# Patient Record
Sex: Female | Born: 1975 | Race: Black or African American | Hispanic: No | Marital: Single | Smoking: Never smoker
Health system: Southern US, Community
[De-identification: ages and names within clinical notes are randomized; demographics above are authoritative.]

## PROBLEM LIST (undated history)

## (undated) DIAGNOSIS — J302 Other seasonal allergic rhinitis: Secondary | ICD-10-CM

## (undated) HISTORY — DX: Other seasonal allergic rhinitis: J30.2

---

## 2006-11-08 ENCOUNTER — Emergency Department (HOSPITAL_COMMUNITY): Admission: EM | Admit: 2006-11-08 | Discharge: 2006-11-08 | Payer: Self-pay | Admitting: Emergency Medicine

## 2006-11-21 ENCOUNTER — Encounter (HOSPITAL_COMMUNITY): Admission: RE | Admit: 2006-11-21 | Discharge: 2006-12-10 | Payer: Self-pay | Admitting: Family Medicine

## 2006-12-12 ENCOUNTER — Encounter (HOSPITAL_COMMUNITY): Admission: RE | Admit: 2006-12-12 | Discharge: 2007-01-11 | Payer: Self-pay | Admitting: Family Medicine

## 2007-01-14 ENCOUNTER — Encounter (HOSPITAL_COMMUNITY): Admission: RE | Admit: 2007-01-14 | Discharge: 2007-02-13 | Payer: Self-pay | Admitting: Family Medicine

## 2007-01-31 ENCOUNTER — Emergency Department (HOSPITAL_COMMUNITY): Admission: EM | Admit: 2007-01-31 | Discharge: 2007-01-31 | Payer: Self-pay | Admitting: Family Medicine

## 2007-04-04 ENCOUNTER — Ambulatory Visit (HOSPITAL_COMMUNITY): Admission: RE | Admit: 2007-04-04 | Discharge: 2007-04-04 | Payer: Self-pay | Admitting: Unknown Physician Specialty

## 2007-05-27 ENCOUNTER — Encounter: Payer: Self-pay | Admitting: Orthopedic Surgery

## 2007-05-29 ENCOUNTER — Ambulatory Visit: Payer: Self-pay | Admitting: Orthopedic Surgery

## 2007-06-07 ENCOUNTER — Encounter (HOSPITAL_COMMUNITY): Admission: RE | Admit: 2007-06-07 | Discharge: 2007-07-07 | Payer: Self-pay | Admitting: Orthopedic Surgery

## 2007-07-08 ENCOUNTER — Encounter (HOSPITAL_COMMUNITY): Admission: RE | Admit: 2007-07-08 | Discharge: 2007-08-07 | Payer: Self-pay | Admitting: Orthopedic Surgery

## 2007-07-11 ENCOUNTER — Ambulatory Visit: Payer: Self-pay | Admitting: Orthopedic Surgery

## 2007-08-26 ENCOUNTER — Ambulatory Visit: Payer: Self-pay | Admitting: Orthopedic Surgery

## 2007-10-24 ENCOUNTER — Ambulatory Visit (HOSPITAL_COMMUNITY): Admission: RE | Admit: 2007-10-24 | Discharge: 2007-10-24 | Payer: Self-pay | Admitting: Otolaryngology

## 2007-10-28 ENCOUNTER — Ambulatory Visit: Payer: Self-pay | Admitting: Orthopedic Surgery

## 2007-10-28 DIAGNOSIS — S93409A Sprain of unspecified ligament of unspecified ankle, initial encounter: Secondary | ICD-10-CM | POA: Insufficient documentation

## 2007-11-18 ENCOUNTER — Ambulatory Visit: Payer: Self-pay | Admitting: Orthopedic Surgery

## 2007-12-16 ENCOUNTER — Ambulatory Visit: Payer: Self-pay | Admitting: Orthopedic Surgery

## 2007-12-26 ENCOUNTER — Telehealth: Payer: Self-pay | Admitting: Orthopedic Surgery

## 2007-12-31 ENCOUNTER — Ambulatory Visit: Payer: Self-pay | Admitting: Orthopedic Surgery

## 2007-12-31 ENCOUNTER — Ambulatory Visit (HOSPITAL_COMMUNITY): Admission: RE | Admit: 2007-12-31 | Discharge: 2007-12-31 | Payer: Self-pay | Admitting: Orthopedic Surgery

## 2008-01-02 ENCOUNTER — Ambulatory Visit: Payer: Self-pay | Admitting: Orthopedic Surgery

## 2008-01-06 ENCOUNTER — Encounter (HOSPITAL_COMMUNITY): Admission: RE | Admit: 2008-01-06 | Discharge: 2008-02-05 | Payer: Self-pay | Admitting: Orthopedic Surgery

## 2008-01-06 ENCOUNTER — Encounter: Payer: Self-pay | Admitting: Orthopedic Surgery

## 2008-01-08 ENCOUNTER — Encounter: Payer: Self-pay | Admitting: Orthopedic Surgery

## 2008-01-08 ENCOUNTER — Emergency Department (HOSPITAL_COMMUNITY): Admission: EM | Admit: 2008-01-08 | Discharge: 2008-01-08 | Payer: Self-pay | Admitting: Emergency Medicine

## 2008-01-09 ENCOUNTER — Ambulatory Visit: Payer: Self-pay | Admitting: Orthopedic Surgery

## 2008-01-16 ENCOUNTER — Ambulatory Visit: Payer: Self-pay | Admitting: Orthopedic Surgery

## 2008-01-29 ENCOUNTER — Encounter: Payer: Self-pay | Admitting: Orthopedic Surgery

## 2008-01-30 ENCOUNTER — Ambulatory Visit: Payer: Self-pay | Admitting: Orthopedic Surgery

## 2008-02-11 ENCOUNTER — Encounter (HOSPITAL_COMMUNITY): Admission: RE | Admit: 2008-02-11 | Discharge: 2008-03-12 | Payer: Self-pay | Admitting: Orthopedic Surgery

## 2008-02-13 ENCOUNTER — Ambulatory Visit: Payer: Self-pay | Admitting: Orthopedic Surgery

## 2008-02-26 ENCOUNTER — Ambulatory Visit (HOSPITAL_COMMUNITY): Payer: Self-pay | Admitting: Psychiatry

## 2008-03-06 ENCOUNTER — Ambulatory Visit (HOSPITAL_COMMUNITY): Payer: Self-pay | Admitting: Psychiatry

## 2008-03-10 ENCOUNTER — Encounter: Payer: Self-pay | Admitting: Orthopedic Surgery

## 2008-03-11 ENCOUNTER — Ambulatory Visit (HOSPITAL_COMMUNITY): Payer: Self-pay | Admitting: Psychiatry

## 2008-03-12 ENCOUNTER — Ambulatory Visit: Payer: Self-pay | Admitting: Orthopedic Surgery

## 2008-03-13 ENCOUNTER — Encounter (HOSPITAL_COMMUNITY): Admission: RE | Admit: 2008-03-13 | Discharge: 2008-04-12 | Payer: Self-pay | Admitting: Orthopedic Surgery

## 2008-03-19 ENCOUNTER — Ambulatory Visit (HOSPITAL_COMMUNITY): Payer: Self-pay | Admitting: Psychiatry

## 2008-03-19 ENCOUNTER — Encounter: Payer: Self-pay | Admitting: Orthopedic Surgery

## 2008-03-31 ENCOUNTER — Encounter: Payer: Self-pay | Admitting: Orthopedic Surgery

## 2008-04-01 ENCOUNTER — Ambulatory Visit (HOSPITAL_COMMUNITY): Payer: Self-pay | Admitting: Psychiatry

## 2008-04-21 ENCOUNTER — Ambulatory Visit: Payer: Self-pay | Admitting: Orthopedic Surgery

## 2008-04-28 ENCOUNTER — Ambulatory Visit (HOSPITAL_COMMUNITY): Payer: Self-pay | Admitting: Psychiatry

## 2008-04-30 ENCOUNTER — Encounter: Payer: Self-pay | Admitting: Orthopedic Surgery

## 2008-05-05 ENCOUNTER — Ambulatory Visit (HOSPITAL_COMMUNITY): Payer: Self-pay | Admitting: Psychiatry

## 2008-05-08 ENCOUNTER — Encounter (HOSPITAL_COMMUNITY): Admission: RE | Admit: 2008-05-08 | Discharge: 2008-06-07 | Payer: Self-pay | Admitting: Orthopedic Surgery

## 2008-05-14 ENCOUNTER — Ambulatory Visit (HOSPITAL_COMMUNITY): Payer: Self-pay | Admitting: Psychiatry

## 2008-05-26 ENCOUNTER — Telehealth: Payer: Self-pay | Admitting: Orthopedic Surgery

## 2008-05-27 ENCOUNTER — Ambulatory Visit (HOSPITAL_COMMUNITY): Payer: Self-pay | Admitting: Psychiatry

## 2008-06-10 ENCOUNTER — Ambulatory Visit: Payer: Self-pay | Admitting: Orthopedic Surgery

## 2008-06-16 ENCOUNTER — Ambulatory Visit (HOSPITAL_COMMUNITY): Payer: Self-pay | Admitting: Psychiatry

## 2008-07-01 ENCOUNTER — Ambulatory Visit (HOSPITAL_COMMUNITY): Payer: Self-pay | Admitting: Psychiatry

## 2008-07-03 ENCOUNTER — Encounter: Payer: Self-pay | Admitting: Orthopedic Surgery

## 2008-07-09 ENCOUNTER — Encounter: Payer: Self-pay | Admitting: Orthopedic Surgery

## 2008-07-10 ENCOUNTER — Telehealth: Payer: Self-pay | Admitting: Orthopedic Surgery

## 2008-07-15 ENCOUNTER — Ambulatory Visit (HOSPITAL_COMMUNITY): Payer: Self-pay | Admitting: Psychiatry

## 2008-08-24 ENCOUNTER — Ambulatory Visit (HOSPITAL_COMMUNITY): Payer: Self-pay | Admitting: Psychiatry

## 2008-09-07 ENCOUNTER — Ambulatory Visit (HOSPITAL_COMMUNITY): Payer: Self-pay | Admitting: Psychiatry

## 2008-12-28 ENCOUNTER — Ambulatory Visit: Payer: Self-pay | Admitting: Orthopedic Surgery

## 2008-12-28 DIAGNOSIS — M25579 Pain in unspecified ankle and joints of unspecified foot: Secondary | ICD-10-CM | POA: Insufficient documentation

## 2009-01-04 ENCOUNTER — Ambulatory Visit (HOSPITAL_COMMUNITY): Payer: Self-pay | Admitting: Psychiatry

## 2009-04-23 ENCOUNTER — Ambulatory Visit (HOSPITAL_COMMUNITY): Admission: RE | Admit: 2009-04-23 | Discharge: 2009-04-23 | Payer: Self-pay | Admitting: Gastroenterology

## 2010-06-21 ENCOUNTER — Emergency Department (HOSPITAL_COMMUNITY): Admission: EM | Admit: 2010-06-21 | Discharge: 2010-06-21 | Payer: Self-pay | Admitting: Emergency Medicine

## 2011-01-01 ENCOUNTER — Encounter: Payer: Self-pay | Admitting: Family Medicine
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
## 2011-08-31 LAB — HCG, QUANTITATIVE, PREGNANCY: hCG, Beta Chain, Quant, S: <2

## 2011-08-31 LAB — CBC
MCHC: 33.3
MCV: 91.3
Platelets: 157
WBC: 8

## 2011-08-31 LAB — DIFFERENTIAL
Basophils Absolute: 0
Basophils Relative: 0
Eosinophils Absolute: 0.2
Neutrophils Relative %: 67

## 2011-08-31 LAB — BASIC METABOLIC PANEL
BUN: 14
CO2: 28
Chloride: 104
Creatinine, Ser: 0.64

## 2014-09-28 ENCOUNTER — Ambulatory Visit: Payer: Self-pay | Admitting: Podiatry

## 2015-06-01 ENCOUNTER — Other Ambulatory Visit (INDEPENDENT_AMBULATORY_CARE_PROVIDER_SITE_OTHER): Payer: BC Managed Care – PPO

## 2015-06-01 ENCOUNTER — Ambulatory Visit (INDEPENDENT_AMBULATORY_CARE_PROVIDER_SITE_OTHER): Payer: BC Managed Care – PPO | Admitting: Family Medicine

## 2015-06-01 ENCOUNTER — Encounter: Payer: Self-pay | Admitting: Family Medicine

## 2015-06-01 VITALS — BP 122/74 | HR 71 | Ht 64.0 in | Wt 139.0 lb

## 2015-06-01 DIAGNOSIS — M25569 Pain in unspecified knee: Secondary | ICD-10-CM

## 2015-06-01 DIAGNOSIS — M899 Disorder of bone, unspecified: Secondary | ICD-10-CM

## 2015-06-01 DIAGNOSIS — M542 Cervicalgia: Secondary | ICD-10-CM | POA: Diagnosis not present

## 2015-06-01 NOTE — Progress Notes (Signed)
Pre visit review using our clinic review tool, if applicable. No additional management support is needed unless otherwise documented below in the visit note. 
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
## 2015-06-01 NOTE — Patient Instructions (Addendum)
Good to see you.  Ice 20 minutes 2 times daily. Usually after activity and before bed. Exercises 3 times a week.  pennsaid pinkie amount topically 2 times daily as needed. Can try on knee as well Vitamin D 2000 IU daily Turmeric 500mg  twice daily Monitor at eye level with sitting Tennisball between shoulder blades with sitting OK to take tennisball and rub area when needed, especially when sitting.  See me again in 4 weeks.

## 2015-06-01 NOTE — Assessment & Plan Note (Signed)
Neck pain is multifactorial. Patient does have full range of motion and negative Spurling's. I do not think that this is likely the problem. I do think the patient does have some scapular dyskinesia but more likely to cause more of the discomfort. We discussed muscle conditioning, postural changes that were, topical anti-inflammatory's and over-the-counter natural supplements. Patient will try to make these different changes and come back and see me again in 4 weeks for further evaluation and treatment.

## 2015-06-11 ENCOUNTER — Telehealth: Payer: Self-pay | Admitting: Family Medicine

## 2015-06-11 NOTE — Telephone Encounter (Signed)
Patient need a refill of the samples of Anti inflammatory cream you gave her , she do not know what the name of it is, please advise

## 2015-06-15 MED ORDER — DICLOFENAC SODIUM 2 % TD SOLN: TRANSDERMAL | Status: DC

## 2015-06-15 NOTE — Telephone Encounter (Signed)
rx sent into pharmacy, pt made aware.  

## 2015-06-29 ENCOUNTER — Ambulatory Visit (INDEPENDENT_AMBULATORY_CARE_PROVIDER_SITE_OTHER): Payer: BC Managed Care – PPO | Admitting: Family Medicine

## 2015-06-29 ENCOUNTER — Encounter: Payer: Self-pay | Admitting: Family Medicine

## 2015-06-29 VITALS — BP 118/78 | HR 73 | Wt 141.0 lb

## 2015-06-29 DIAGNOSIS — S83206A Unspecified tear of unspecified meniscus, current injury, right knee, initial encounter: Secondary | ICD-10-CM | POA: Diagnosis not present

## 2015-06-29 DIAGNOSIS — M899 Disorder of bone, unspecified: Secondary | ICD-10-CM

## 2015-06-29 NOTE — Progress Notes (Signed)
Tawana ScaleZach Smith D.O. [facility] [address] [place], [place] [address] Phone: [phone] Subjective:     CC: Neck pain follow up  BJY:NWGNFAOZHYHPI:Subjective Audrey Bradford is a 39 y.o. female coming in with complaint of neck pain. Patient was seen previously and had more of muscle imbalance. Patient was given postural changes, ergonomic changes to at work, home exercises, icing protocol and discussed over-the-counter natural supplementations. Patient states the neck pain is improving. Patient states as long as she does he exercises she has noticed some improvement. States that it is no longer affecting her work. Patient states that she is also not noticing any of the severe discomfort that was stopped him from activities.  Patient is coming in with new problem. States that she is having more of a right knee pain. Rates the severity of pain is 8 out of 10. Has seen another provider for this before and did have x-rays that she states were unremarkable. Patient did do some physical therapy with no benefit. Patient was sent to rheumatology with workup that was negative. Patient has not had any advanced imaging. Still hurts her when she goes up and down stairs. States that it's more on the medial aspect of the knee. Denies any radiation of pain. States that sometimes he can feel a little unstable. Denies any nighttime awakening.  History reviewed. No pertinent past medical history. History reviewed. No pertinent past surgical history. History  Substance Use Topics  . Smoking status: Never Smoker   . Smokeless tobacco: Not on file  . Alcohol Use: Not on file   No Known Allergies History reviewed. No pertinent family history.     Past medical history, social, surgical and family history all reviewed in electronic medical record.   Review of Systems: No headache, visual changes, nausea, vomiting, diarrhea, constipation, dizziness, abdominal pain, skin rash, fevers, chills, night sweats,  weight loss, swollen lymph nodes, body aches, joint swelling, muscle aches, chest pain, shortness of breath, mood changes.   Objective Blood pressure 118/78, pulse 73, weight 141 lb (63.957 kg), SpO2 97 %.  General: No apparent distress alert and oriented x3 mood and affect normal, dressed appropriately.  HEENT: Pupils equal, extraocular movements intact  Respiratory: Patient's speak in full sentences and does not appear short of breath  Cardiovascular: No lower extremity edema, non tender, no erythema  Skin: Warm dry intact with no signs of infection or rash on extremities or on axial skeleton.  Abdomen: Soft nontender  Neuro: Cranial nerves II through XII are intact, neurovascularly intact in all extremities with 2+ DTRs and 2+ pulses.  Lymph: No lymphadenopathy of posterior or anterior cervical chain or axillae bilaterally.  Gait normal with good balance and coordination.  MSK:  Non tender with full range of motion and good stability and symmetric strength and tone of shoulders, elbows, wrist, hip, and ankles bilaterally.  Neck: Inspection unremarkable. No palpable stepoffs. Negative Spurling's maneuver. Full neck range of motion Grip strength and sensation normal in bilateral hands Strength good C4 to T1 distribution No sensory change to C4 to T1 Negative Hoffman sign bilaterally Reflexes normal Mild scapular dyskinesia  Knee: Right Normal to inspection with no erythema or effusion or obvious bony abnormalities. Palpation normal with no warmth, joint line tenderness, patellar tenderness, or condyle tenderness. ROM full in flexion and extension and lower leg rotation. Ligaments with solid consistent endpoints including ACL, PCL, LCL, MCL. Positive Mcmurray's, Apley's, and Thessalonian tests. Non painful patellar compression. Patellar glide without crepitus.  Patellar and quadriceps tendons unremarkable. Hamstring and quadriceps strength is normal.   Procedure note After  informed written and verbal consent, patient was seated on exam table. Right knee was prepped with alcohol swab and utilizing anterolateral approach, patient's right knee space was injected with 4:1  marcaine 0.5%: Kenalog /dL. Patient tolerated the procedure well without immediate complications.   Impression and Recommendations:     This case required medical decision making of moderate complexity.

## 2015-06-29 NOTE — Assessment & Plan Note (Signed)
Improvement with the postural control. We discussed icing regimen and home exercises. We discussed continuing the topical anti-rheumatoid. We discussed continuing the ergonomic changes that I think is helping patient.

## 2015-06-29 NOTE — Patient Instructions (Addendum)
Good to see you Ice can and always will help with any inflammation Continue with  the tennis ball.  New exercises for the knee same about 3 times a week.  We tried injection in the knee and it should help See me again in 4-6 weeks.

## 2015-06-29 NOTE — Assessment & Plan Note (Signed)
Likely genitive in nature. We discussed icing regimen, home exercises, continuing the topical anti-inflammatory's. Patient was given an injection today and states that there was some benefit. Patient has not had an injection previously. We discussed range of motion exercises and patient was given a handout. Patient will try to make these changes and come back and see me again in 3-4 weeks for further evaluation and treatment.

## 2015-08-10 ENCOUNTER — Ambulatory Visit: Payer: BC Managed Care – PPO | Admitting: Family Medicine

## 2015-09-16 ENCOUNTER — Telehealth: Payer: Self-pay | Admitting: Family Medicine

## 2015-09-16 NOTE — Telephone Encounter (Signed)
Spoke to pt, she was requesting her BP at last OV for her insurance.

## 2015-09-16 NOTE — Telephone Encounter (Signed)
Pt request to speak to the assistant concern about last ov she had with Dr. Katrinka Blazing. Pt need some information from that last ov. Please call her back

## 2015-09-16 NOTE — Telephone Encounter (Signed)
lmovm for pt to return call.  

## 2018-04-30 ENCOUNTER — Ambulatory Visit (INDEPENDENT_AMBULATORY_CARE_PROVIDER_SITE_OTHER): Payer: BC Managed Care – PPO

## 2018-04-30 ENCOUNTER — Ambulatory Visit (HOSPITAL_COMMUNITY)
Admission: EM | Admit: 2018-04-30 | Discharge: 2018-04-30 | Disposition: A | Discharge status: Home / Self Care | Payer: BC Managed Care – PPO | Attending: Family Medicine | Admitting: Family Medicine

## 2018-04-30 ENCOUNTER — Encounter (HOSPITAL_COMMUNITY): Payer: Self-pay | Admitting: Family Medicine

## 2018-04-30 DIAGNOSIS — S93491A Sprain of other ligament of right ankle, initial encounter: Secondary | ICD-10-CM | POA: Diagnosis not present

## 2018-04-30 DIAGNOSIS — S60512A Abrasion of left hand, initial encounter: Secondary | ICD-10-CM | POA: Diagnosis not present

## 2018-04-30 MED ORDER — MUPIROCIN 2 % EX OINT: 1.0000 "application " | TOPICAL_OINTMENT | Freq: Three times a day (TID) | CUTANEOUS | 1 refills | Status: DC

## 2018-04-30 MED ORDER — DICLOFENAC SODIUM 75 MG PO TBEC: 75.0000 mg | DELAYED_RELEASE_TABLET | Freq: Two times a day (BID) | ORAL | 0 refills | Status: DC

## 2018-04-30 NOTE — ED Provider Notes (Signed)
[facility]   [id number] 04/30/18 Arrival Time: 7829   SUBJECTIVE:  Audrey Bradford is a 42 y.o. female who presents to the urgent care with complaint of left hand and right ankle pain after falling on concrete.  Right hypothenar area is "opened" with some possible dirt in the wound.  Good ROM.  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  History reviewed. No pertinent past medical history. History reviewed. No pertinent family history. Social History   Socioeconomic History  . Marital status: Single    Spouse name: Not on file  . Number of children: Not on file  . Years of education: Not on file  . Highest education level: Not on file  Occupational History  . Not on file  Social Needs  . Financial resource strain: Not on file  . Food insecurity:    Worry: Not on file    Inability: Not on file  . Transportation needs:    Medical: Not on file    Non-medical: Not on file  Tobacco Use  . Smoking status: Never Smoker  Substance and Sexual Activity  . Alcohol use: Not on file  . Drug use: Not on file  . Sexual activity: Not on file  Lifestyle  . Physical activity:    Days per week: Not on file    Minutes per session: Not on file  . Stress: Not on file  Relationships  . Social connections:    Talks on phone: Not on file    Gets together: Not on file    Attends religious service: Not on file    Active member of club or organization: Not on file    Attends meetings of clubs or organizations: Not on file    Relationship status: Not on file  . Intimate partner violence:    Fear of current or ex partner: Not on file    Emotionally abused: Not on file    Physically abused: Not on file    Forced sexual activity: Not on file  Other Topics Concern  . Not on file  Social History Narrative  . Not on file   No outpatient medications have been marked as taking for the 04/30/18 encounter [facility]  Encounter).   No Known Allergies    ROS: As per HPI, remainder of ROS negative.   OBJECTIVE:   Vitals:   04/30/18 1806  BP: 138/83  Pulse: 75  Resp: 18  SpO2: 100%     General appearance: alert; no distress Eyes: PERRL; EOMI; conjunctiva normal HENT: normocephalic; atraumatic;  oral mucosa normal Neck: supple Back: no CVA tenderness Extremities: no cyanosis or edema; symmetrical with no gross deformities;  Mild tenderness right lateral malleolus  The left hypothenar area has two abrasions taking up 1 cm in length and width each  Skin: warm and dry Neurologic: antalgic gait, otherwise grossly normal Psychological: alert and cooperative; normal mood and affect      Labs:  Results for orders placed or performed during the hospital encounter of 56/21/30  Basic metabolic panel  Result Value Ref Range   Sodium 139    Potassium 3.5    Chloride 104    CO2 28    Glucose, Bld 74    BUN 14    Creatinine, Ser 0.64    Calcium 9.4    GFR calc non Af Amer >60    GFR calc Af Amer      >  60        The eGFR has been calculated using the MDRD equation. This calculation has not been validated in all clinical  CBC  Result Value Ref Range   WBC 8.0    RBC 3.77 (L)    Hemoglobin 11.5 (L)    HCT 34.4 (L)    MCV 91.3    MCHC 33.3    RDW 12.7    Platelets 157   Differential  Result Value Ref Range   Neutrophils Relative % 67    Neutro Abs 5.3    Lymphocytes Relative 24    Lymphs Abs 1.9    Monocytes Relative 7    Monocytes Absolute 0.5    Eosinophils Relative 3    Eosinophils Absolute 0.2    Basophils Relative 0    Basophils Absolute 0.0   hCG, quantitative, pregnancy  Result Value Ref Range   hCG, Beta Chain, Quant, S      <2          GEST. AGE      CONC.  (mIU/mL)   <=1 WEEK        5 - 50     2 WEEKS       50 - 500     3 WEEKS       100 - 10,000    Labs Reviewed - No data to display  Dg Ankle Complete Right  Result Date: 04/30/2018 CLINICAL DATA:   42 year old female with a history ankle injury EXAM: RIGHT ANKLE - COMPLETE 3+ VIEW COMPARISON:  None. FINDINGS: No acute displaced fracture. Ankle mortise is congruent. No focal soft tissue swelling. No radiopaque foreign body. No evidence of joint effusion. IMPRESSION: Negative for acute bony abnormality Electronically Signed   By: Corrie Mckusick D.O.   On: 04/30/2018 18:39       ASSESSMENT & PLAN:  1. Sprain of anterior talofibular ligament of right ankle, initial encounter   2. Abrasion of palm of left hand, initial encounter   Wash the injury on the palm with soap and water at least three times a day and then apply Bactroban ointment and cover with bandage for the next three to five days.  Meds ordered this encounter  Medications  . mupirocin ointment (BACTROBAN) 2 %    Sig: Apply 1 application topically 3 (three) times daily.    Dispense:  22 g    Refill:  1  . diclofenac (VOLTAREN) 75 MG EC tablet    Sig: Take 1 tablet (75 mg total) by mouth 2 (two) times daily.    Dispense:  14 tablet    Refill:  0    Reviewed expectations re: course of current medical issues. Questions answered. Outlined signs and symptoms indicating need for more acute intervention. Patient verbalized understanding. After Visit Summary given.    Procedures:      Robyn Haber, MD 04/30/18 1859

## 2018-04-30 NOTE — ED Triage Notes (Signed)
Pt here for fall this afternoon. She fell on concrete gravel injuring left hand and right ankle.

## 2018-04-30 NOTE — Discharge Instructions (Addendum)
Wash the injury on the palm with soap and water at least three times a day and then apply Bactroban ointment and cover with bandage for the next three to five days.  Result Date: 04/30/2018 CLINICAL DATA:  42 year old female with a history ankle injury EXAM: RIGHT ANKLE - COMPLETE 3+ VIEW COMPARISON:  None. FINDINGS: No acute displaced fracture. Ankle mortise is congruent. No focal soft tissue swelling. No radiopaque foreign body. No evidence of joint effusion. IMPRESSION: Negative for acute bony abnormality Electronically Signed   By: Gilmer Mor D.O.   On: 04/30/2018 18:39

## 2018-08-25 IMAGING — DX DG ANKLE COMPLETE 3+V*R*
3 series · 3 of 3 positions shown · non-contrast
Comparison: None.

CLINICAL DATA: 41-year-old female with a history ankle injury

EXAM:
RIGHT ANKLE - COMPLETE 3+ VIEW

[ankle ap]
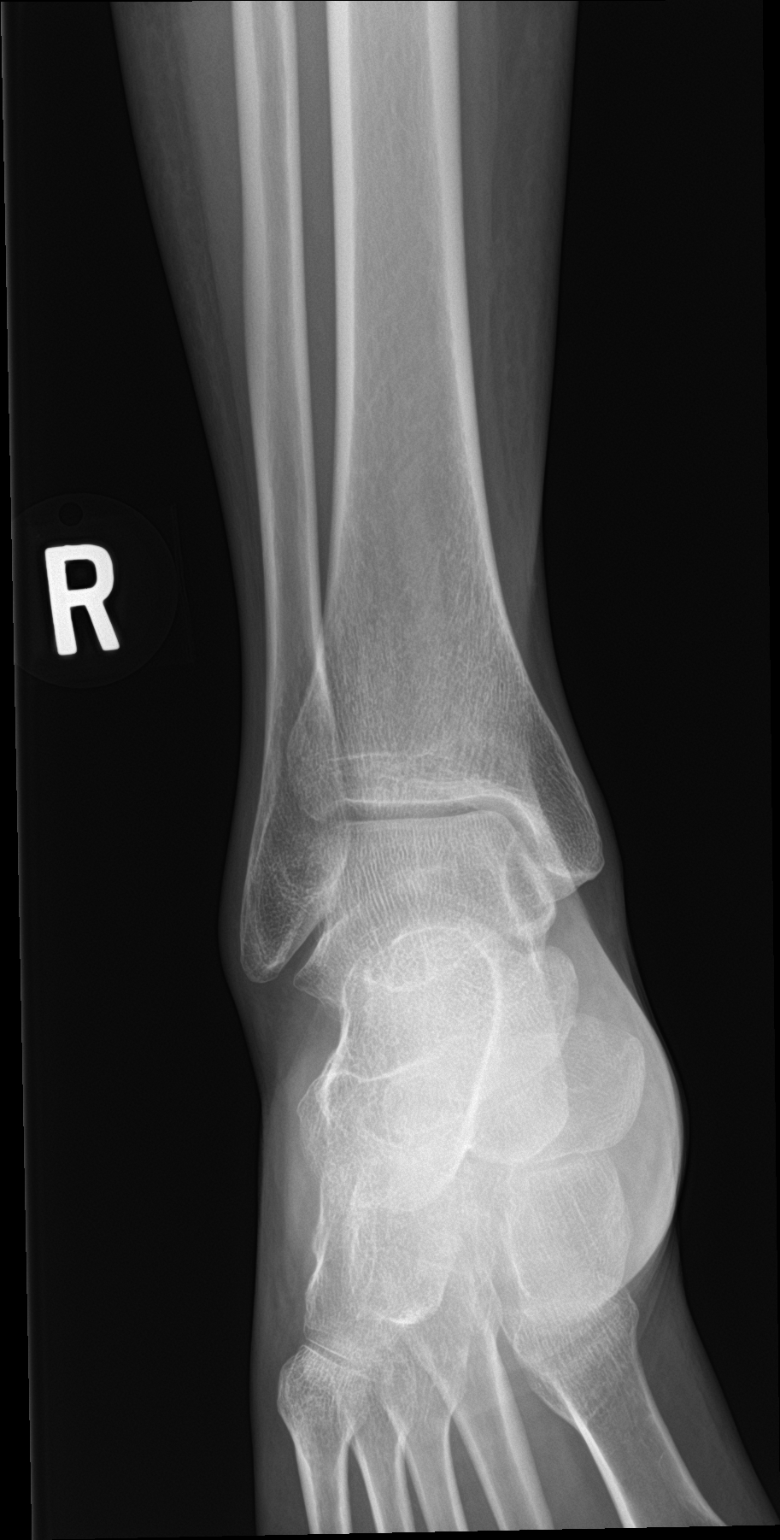

[ankle obl]
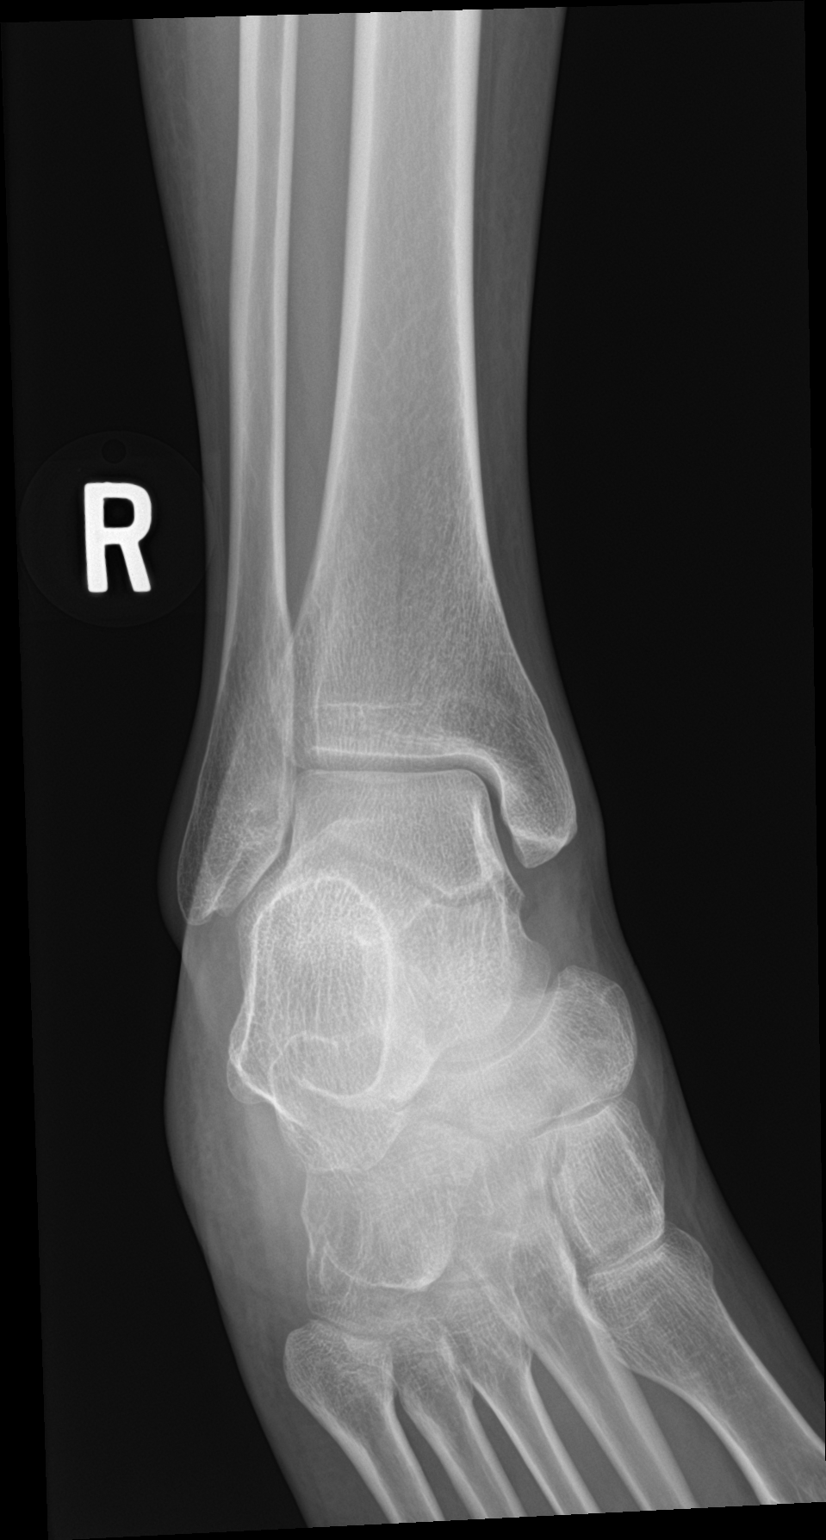

[ankle lat]
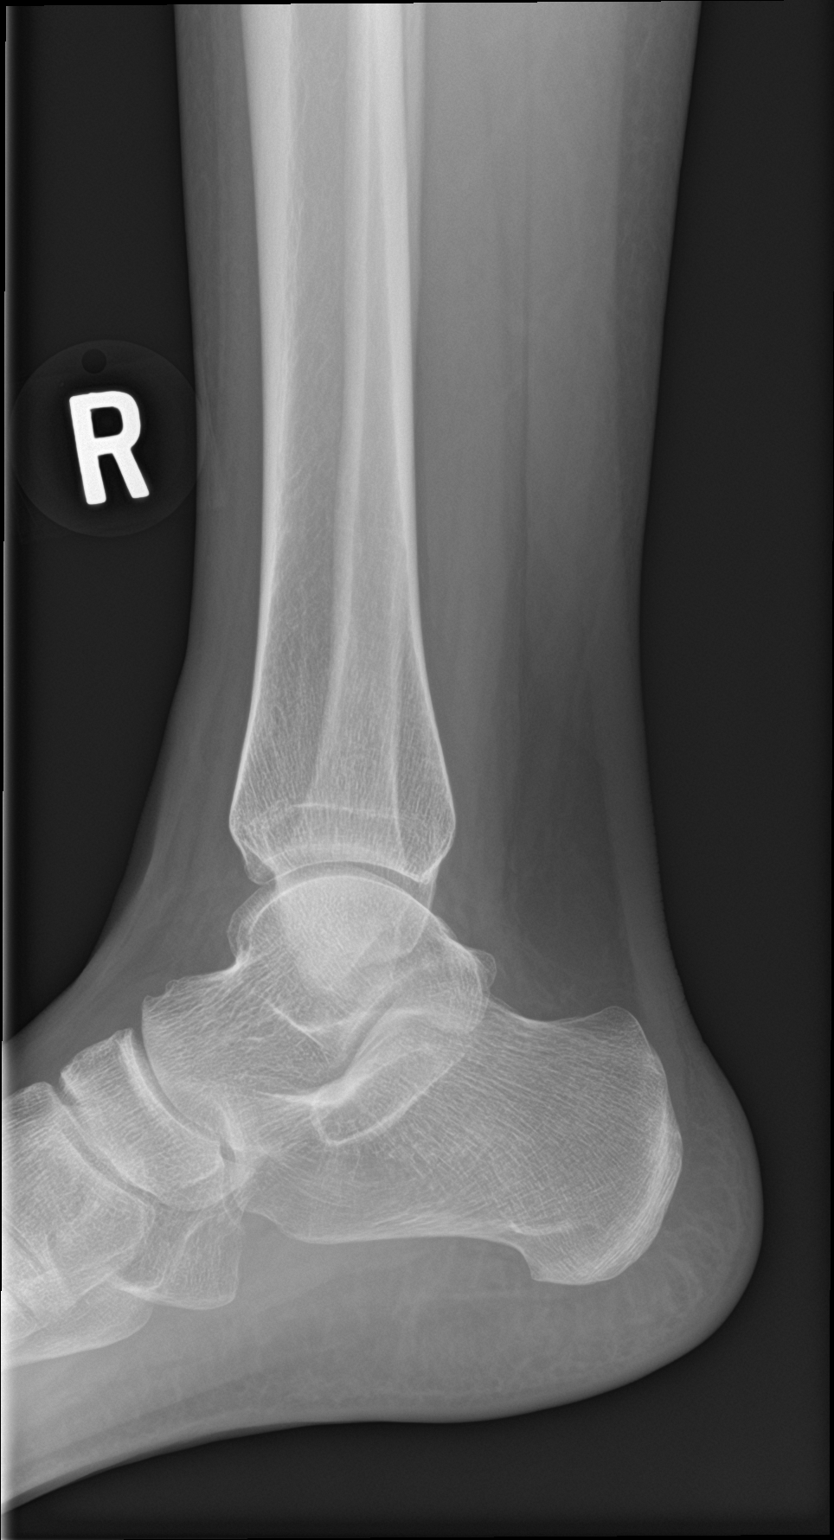

[3 of 3 positions shown; findings below may reference images not displayed]

FINDINGS: No acute displaced fracture. Ankle mortise is congruent. No focal
soft tissue swelling. No radiopaque foreign body. No evidence of
joint effusion.
IMPRESSION: Negative for acute bony abnormality

## 2018-10-02 ENCOUNTER — Ambulatory Visit: Payer: Self-pay | Admitting: Internal Medicine

## 2018-10-02 ENCOUNTER — Encounter: Payer: Self-pay | Admitting: Nurse Practitioner

## 2018-10-02 ENCOUNTER — Ambulatory Visit: Payer: BC Managed Care – PPO | Admitting: Internal Medicine

## 2018-10-02 ENCOUNTER — Encounter: Payer: Self-pay | Admitting: Internal Medicine

## 2018-10-02 VITALS — BP 162/94 | HR 76 | Temp 98.2°F | Ht 64.0 in | Wt 156.2 lb

## 2018-10-02 DIAGNOSIS — F4321 Adjustment disorder with depressed mood: Secondary | ICD-10-CM

## 2018-10-02 DIAGNOSIS — Z23 Encounter for immunization: Secondary | ICD-10-CM

## 2018-10-02 DIAGNOSIS — F5105 Insomnia due to other mental disorder: Secondary | ICD-10-CM | POA: Diagnosis not present

## 2018-10-02 DIAGNOSIS — M549 Dorsalgia, unspecified: Secondary | ICD-10-CM

## 2018-10-02 DIAGNOSIS — R030 Elevated blood-pressure reading, without diagnosis of hypertension: Secondary | ICD-10-CM

## 2018-10-02 DIAGNOSIS — F419 Anxiety disorder, unspecified: Secondary | ICD-10-CM

## 2018-10-02 DIAGNOSIS — F99 Mental disorder, not otherwise specified: Secondary | ICD-10-CM

## 2018-10-02 LAB — POCT URINALYSIS DIPSTICK
Bilirubin, UA: NEGATIVE
Glucose, UA: NEGATIVE
KETONES UA: NEGATIVE
Leukocytes, UA: NEGATIVE
NITRITE UA: NEGATIVE
PH UA: 6.5 (ref 5.0–8.0)
PROTEIN UA: NEGATIVE
RBC UA: NEGATIVE
Spec Grav, UA: 1.01 (ref 1.010–1.025)
Urobilinogen, UA: 0.2 E.U./dL

## 2018-10-03 LAB — TSH: TSH: 3.97 u[IU]/mL (ref 0.450–4.500)

## 2018-10-07 NOTE — Progress Notes (Signed)
Your thyroid function is the upper limit of normal. I will recheck this in six months or so.

## 2018-10-08 ENCOUNTER — Telehealth: Payer: Self-pay

## 2018-10-08 NOTE — Telephone Encounter (Signed)
Patient called stating she is supposed to be billed the fee to have forms filled out but has not received it yet and also she wanted to check the status on her paperwork and she stated she has additional forms that need to be filled out and she would like to email them to [place]. [phone]  Returned pt call and advised her the bill probably will take a couple days to get to her by mail. I also advised her forms take 7-10 business days to be filled out and if she has additional forms to be filled out she would have to drop them off to [place] or fax them over since we do not use email. YRL,RMA

## 2018-10-20 ENCOUNTER — Encounter: Payer: Self-pay | Admitting: Internal Medicine

## 2018-10-20 NOTE — Progress Notes (Signed)
Subjective:     Patient ID: Audrey Bradford , female    DOB: May 05, 1976 , 42 y.o.   MRN: [id number]   Chief Complaint  Patient presents with  . Anxiety  . kidney pain    HPI  She is here today for evaluation of anxiety. She initially was scheduled to see NP Audrey Bradford; however, she created quite a stir in the office and refused to talk to her about her issues. Therefore, I am now seeing the patient. She usually comes once yearly for physical exam. She moved to [place] after getting married, but commutes to [place] daily for work. She works for [employer].   Anxiety  Presents for initial visit. Onset was 1 to 6 months ago. The problem has been gradually worsening. Symptoms include depressed mood, insomnia, irritability and panic. Symptoms occur most days. The severity of symptoms is severe and interfering with daily activities. The symptoms are aggravated by family issues and work stress. The quality of sleep is poor. Nighttime awakenings: one to two.   Risk factors include marital problems.   She initially started to have issues several months ago when she and her husband started to have marital issues. She reports her sx have worsened since she was skipped over for a job position. She felt that management purposely made her think she would get the promotion, then it was given to someone else. Although she is disappointed, she has no suicidal ideations.   History reviewed. No pertinent past medical history.   Family History  Problem Relation Age of Onset  . Schizophrenia Mother   . Hypertension Mother   . Hypertension Father   . Diabetes Father   . Esophageal cancer Father   . Congestive Heart Failure Father      Current Outpatient Medications:  .  diclofenac (VOLTAREN) 75 MG EC tablet, Take 1 tablet (75 mg total) by mouth 2 (two) times daily., Disp: 14 tablet, Rfl: 0 .  Diclofenac Sodium 2 % SOLN, Apply 1 pump twice daily., Disp: 112 g, Rfl: 3 .   mupirocin ointment (BACTROBAN) 2 %, Apply 1 application topically 3 (three) times daily., Disp: 22 g, Rfl: 1   No Known Allergies   Review of Systems  Constitutional: Positive for irritability.  Respiratory: Negative.   Cardiovascular: Negative.   Gastrointestinal: Negative.   Neurological: Negative.   Psychiatric/Behavioral: Positive for agitation. The patient has insomnia.      Today's Vitals   10/02/18 1131  BP: (!) 162/94  Pulse: 76  Temp: 98.2 F (36.8 C)  TempSrc: Oral  Weight: 156 lb 3.2 oz (70.9 kg)  Height: 5\' 4"  (1.626 m)  PainSc: 8   PainLoc: Back   Body mass index is 26.81 kg/m.   Objective:  Physical Exam  Constitutional: She is oriented to person, place, and time. She appears well-developed and well-nourished.  Cardiovascular: Normal rate, regular rhythm and normal heart sounds.  Pulmonary/Chest: Effort normal and breath sounds normal.  Neurological: She is alert and oriented to person, place, and time.  Psychiatric: Her mood appears anxious.  She is also tearful.   Nursing note and vitals reviewed.       Assessment And Plan:     1. Anxiety  Unfortunately, she is experiencing both work and marital stressors. She is encouraged to start therapy. She is encouraged to first look into EAP program through her job. Pt advised other option is to seek counseling through a private provider. I suggest that she start rx  medication; however, she declines at this time. She is concerned about possible side effects. I will check thyroid function as well. She will be taken out of work for the next 3 weeks. She will rto in 3 weeks for re-evaluation. Pt advised she must seek counseling during this time. Greater than 50% of face to face time was spent in counseling and coordination of care. More than 40 minutes was spent with the patient.   - TSH  2. Adjustment disorder with depressed mood  Please see above. Again, she declines medication.   3. Back pain, unspecified  back location, unspecified back pain laterality, unspecified chronicity  Possibly due to musculoskeletal strain. U/a also performed to r/o urinary cause.   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 She will let me know if her sx persist. Good bedtime hygiene was also discussed with the patient. We will discuss further at her next visit.   5. Elevated blood pressure reading  I think this is related to her current distress.   She is encouraged to avoid adding salt to her foods. EKG performed, no acute changes noted. She will rto in six weeks for re-evaluation.   - EKG 12-Lead  6. Need for influenza vaccination  - Flu Vaccine QUAD 6+ mos PF IM (Fluarix Quad PF)        Gwynneth Aliment, MD

## 2018-10-21 ENCOUNTER — Encounter: Payer: Self-pay | Admitting: Internal Medicine

## 2018-10-21 ENCOUNTER — Telehealth: Payer: Self-pay

## 2018-10-21 NOTE — Telephone Encounter (Signed)
The pt called and left a message that she has seen Dr. Bevely Palmer at [facility] and her number is [phone].

## 2018-10-21 NOTE — Telephone Encounter (Signed)
Left the pt a message that Dr. Allyne Gee wants to know if the pt has sought canceling and if so Dr. Allyne Gee needs the name and number of the provider she has chosen.

## 2018-10-22 ENCOUNTER — Encounter: Payer: Self-pay | Admitting: Internal Medicine

## 2018-10-22 ENCOUNTER — Ambulatory Visit: Payer: BC Managed Care – PPO | Admitting: Internal Medicine

## 2018-10-22 VITALS — BP 114/80 | HR 71 | Temp 98.7°F | Ht 64.0 in | Wt 154.8 lb

## 2018-10-22 DIAGNOSIS — F419 Anxiety disorder, unspecified: Secondary | ICD-10-CM | POA: Diagnosis not present

## 2018-11-10 DIAGNOSIS — F419 Anxiety disorder, unspecified: Secondary | ICD-10-CM | POA: Insufficient documentation

## 2018-11-10 NOTE — Progress Notes (Signed)
  Subjective:     Patient ID: Audrey Bradford , female    DOB: Oct 21, 1976 , 42 y.o.   MRN: [id number]   Chief Complaint  Patient presents with  . Anxiety    HPI  Anxiety  Presents for follow-up visit. Symptoms include nervous/anxious behavior. Symptoms occur occasionally. The severity of symptoms is mild. The quality of sleep is fair. Nighttime awakenings: occasional.    SHE DID NOT START MEDS. HER SX HAVE IMPROVED WITH THERAPY. SHE DOES GET SLIGHTLY ANXIOUS WHEN THINKING ABOUT RETURNING TO WORK.    History reviewed. No pertinent past medical history.   Family History  Problem Relation Age of Onset  . Schizophrenia Mother   . Hypertension Mother   . Hypertension Father   . Diabetes Father   . Esophageal cancer Father   . Congestive Heart Failure Father      Current Outpatient Medications:  .  benzonatate (TESSALON) 200 MG capsule, , Disp: , Rfl: 0 .  ipratropium (ATROVENT) 0.03 % nasal spray, Place into the nose., Disp: , Rfl:    No Known Allergies   Review of Systems  Constitutional: Negative.   Respiratory: Negative.   Cardiovascular: Negative.   Gastrointestinal: Negative.   Neurological: Negative.   Psychiatric/Behavioral: The patient is nervous/anxious.      Today's Vitals   10/22/18 1519  BP: 114/80  Pulse: 71  Temp: 98.7 F (37.1 C)  TempSrc: Oral  Weight: 154 lb 12.8 oz (70.2 kg)  Height: 5\' 4"  (1.626 m)   Body mass index is 26.57 kg/m.   Objective:  Physical Exam  Constitutional: She is oriented to person, place, and time. She appears well-developed and well-nourished.  HENT:  Head: Normocephalic and atraumatic.  Eyes: EOM are normal.  Cardiovascular: Normal rate, regular rhythm and normal heart sounds.  Pulmonary/Chest: Effort normal and breath sounds normal.  Neurological: She is alert and oriented to person, place, and time.  Psychiatric: She has a normal mood and affect.  Nursing note and vitals reviewed.       Assessment  And Plan:     1. Anxiety  IMPROVED WITH THERAPY. SHE IS ENCOURAGED TO CONTINUE WITH MAGNESIUM SUPPLEMENTATION NIGHTLY. SHE WILL BE GIVEN A RELEASE TO RETURN TO WORK. SHE IS ENCOURAGED TO RETURN TO OFFICE SHOULD HER SX RECUR.   Gwynneth Alimentobyn N Shirly Bartosiewicz, MD

## 2019-04-14 ENCOUNTER — Ambulatory Visit (INDEPENDENT_AMBULATORY_CARE_PROVIDER_SITE_OTHER): Payer: BC Managed Care – PPO | Admitting: Internal Medicine

## 2019-04-14 ENCOUNTER — Other Ambulatory Visit: Payer: Self-pay

## 2019-04-14 ENCOUNTER — Encounter: Payer: Self-pay | Admitting: Internal Medicine

## 2019-04-14 VITALS — BP 154/94 | HR 73 | Temp 97.5°F | Ht 64.0 in | Wt 155.0 lb

## 2019-04-14 DIAGNOSIS — R7989 Other specified abnormal findings of blood chemistry: Secondary | ICD-10-CM | POA: Diagnosis not present

## 2019-04-14 DIAGNOSIS — R030 Elevated blood-pressure reading, without diagnosis of hypertension: Secondary | ICD-10-CM | POA: Diagnosis not present

## 2019-04-14 DIAGNOSIS — R0982 Postnasal drip: Secondary | ICD-10-CM | POA: Diagnosis not present

## 2019-04-14 DIAGNOSIS — J029 Acute pharyngitis, unspecified: Secondary | ICD-10-CM

## 2019-04-14 DIAGNOSIS — Z7189 Other specified counseling: Secondary | ICD-10-CM

## 2019-04-14 MED ORDER — LEVOCETIRIZINE DIHYDROCHLORIDE 5 MG PO TABS: 5.0000 mg | ORAL_TABLET | Freq: Every evening | ORAL | 1 refills | Status: AC

## 2019-04-14 NOTE — Progress Notes (Signed)
Virtual Visit via Video   This visit type was conducted due to national recommendations for restrictions regarding the COVID-19 Pandemic (e.g. social distancing) in an effort to limit this patient's exposure and mitigate transmission in our community.  Due to her co-morbid illnesses, this patient is at least at moderate risk for complications without adequate follow up.  This format is felt to be most appropriate for this patient at this time.  All issues noted in this document were discussed and addressed.  A limited physical exam was performed with this format.    This visit type was conducted due to national recommendations for restrictions regarding the COVID-19 Pandemic (e.g. social distancing) in an effort to limit this patient's exposure and mitigate transmission in our community.  Patients identity confirmed using two different identifiers.  This format is felt to be most appropriate for this patient at this time.  All issues noted in this document were discussed and addressed.  No physical exam was performed (except for noted visual exam findings with Video Visits).    Date:  04/14/2019   ID:  Audrey Bradford, DOB 09-Apr-1976, MRN [id number]  Patient Location:  Home  Provider location:   Office    Chief Complaint:  F/u thyroid labs  History of Present Illness:    Audrey Bradford is a 43 y.o. female who presents via video conferencing for a telehealth visit today.    The patient does not have symptoms concerning for COVID-19 infection (fever, chills, cough, or new shortness of breath).   She presents today for virtual visit. She prefers this method of contact due to COVID-19 pandemic.  She was scheduled to come in for f/u abnormal thyroid labs. She is now working from home. She was recently offered a position in [employer]. She is excited because the commute to [place] was taxing on her. Although she is excited about her new position, she is also  anxious about the move to a new school/position.     Past Medical History:  Diagnosis Date  . Seasonal allergies    History reviewed. No pertinent surgical history.   Current Meds  Medication Sig  . Cholecalciferol (VITAMIN D3 SUPER STRENGTH) 50 MCG (2000 UT) CAPS Take by mouth.     Allergies:   Patient has no known allergies.   Social History   Tobacco Use  . Smoking status: Never Smoker  . Smokeless tobacco: Never Used  Substance Use Topics  . Alcohol use: Never    Alcohol/week: 0.0 standard drinks    Frequency: Never  . Drug use: Never     Family Hx: The patient's family history includes Congestive Heart Failure in her father; Diabetes in her father; Esophageal cancer in her father; Hypertension in her father and mother; Schizophrenia in her mother.  ROS:   Please see the history of present illness.    Review of Systems  Constitutional: Negative.   HENT: Positive for sore throat.        She often awakens and has to clear her throat in the mornings.   Respiratory: Negative.   Cardiovascular: Negative.   Gastrointestinal: Negative.   Neurological: Negative.   Psychiatric/Behavioral: Negative.     All other systems reviewed and are negative.   Labs/Other Tests and Data Reviewed:    Recent Labs: 10/02/2018: TSH 3.970   Recent Lipid Panel No results found for: CHOL, TRIG, HDL, CHOLHDL, LDLCALC, LDLDIRECT  Wt Readings from Last 3 Encounters:  04/14/19 155 lb (70.3 kg)  10/22/18 154 lb 12.8 oz (70.2 kg)  10/02/18 156 lb 3.2 oz (70.9 kg)     Exam:    Vital Signs:  BP (!) 154/94 (BP Location: Left Arm, Patient Position: Sitting, Cuff Size: Normal) Comment: pt provided  Pulse 73   Temp (!) 97.5 F (36.4 C) (Oral) Comment: pt provided  Ht 5\' 4"  (1.626 m)   Wt 155 lb (70.3 kg) Comment: pt provided  LMP 04/06/2019   BMI 26.61 kg/m     Physical Exam  Constitutional: She is oriented to person, place, and time and well-developed, well-nourished, and in no  distress.  HENT:  Head: Normocephalic and atraumatic.  Neck: Normal range of motion.  Pulmonary/Chest: Effort normal.  Neurological: She is alert and oriented to person, place, and time.  Psychiatric: Affect normal.  Nursing note and vitals reviewed.   ASSESSMENT & PLAN:     1. Abnormal thyroid blood test  She would like to have her labs drawn at a [facility] facility in [place]. Orders have been put into the system. I will contact her once her labs are available for review.   - TSH; Future - T4, Free; Future  2. Elevated blood pressure reading  She is encouraged to adopt DASH eating plan. She was given information to read at her leisure. She agrees to come in four weeks for re-evaluation. She is also encouraged to exercise no less than five days per week.   3. Postnasal drip  She will switch from Allegra to Xyzal 5mg  nightly. She is aware that this is over the counter.   4. Sore throat  I will check labs as listed below. Pt advised that her sx are likely due to PND. She will let me know if her sx persist after switching her medications as above.   - CBC with Diff; Future    COVID-19 Education: The signs and symptoms of COVID-19 were discussed with the patient and how to seek care for testing (follow up with PCP or arrange E-visit).  The importance of social distancing was discussed today.  Patient Risk:   After full review of this patients clinical status, I feel that they are at least moderate risk at this time.  Time:   Today, I have spent 15 minutes/ 45 seconds with the patient with telehealth technology discussing above diagnoses.     Medication Adjustments/Labs and Tests Ordered: Current medicines are reviewed at length with the patient today.  Concerns regarding medicines are outlined above.   Tests Ordered: Orders Placed This Encounter  Procedures  . TSH  . T4, Free  . CBC with Diff    Medication Changes: Meds ordered this encounter  Medications  .  levocetirizine (XYZAL) 5 MG tablet    Sig: Take 1 tablet (5 mg total) by mouth every evening.    Dispense:  30 tablet    Refill:  1    Disposition:  Follow up in 4 week(s)  Signed, Gwynneth Alimentobyn N Darriel Sinquefield, MD

## 2019-04-14 NOTE — Patient Instructions (Addendum)
XYZAL - get for allergies     DASH Eating Plan DASH stands for "Dietary Approaches to Stop Hypertension." The DASH eating plan is a healthy eating plan that has been shown to reduce high blood pressure (hypertension). It may also reduce your risk for type 2 diabetes, heart disease, and stroke. The DASH eating plan may also help with weight loss. What are tips for following this plan?  General guidelines  Avoid eating more than 2,300 mg (milligrams) of salt (sodium) a day. If you have hypertension, you may need to reduce your sodium intake to 1,500 mg a day.  Limit alcohol intake to no more than 1 drink a day for nonpregnant women and 2 drinks a day for men. One drink equals 12 oz of beer, 5 oz of wine, or 1 oz of hard liquor.  Work with your health care provider to maintain a healthy body weight or to lose weight. Ask what an ideal weight is for you.  Get at least 30 minutes of exercise that causes your heart to beat faster (aerobic exercise) most days of the week. Activities may include walking, swimming, or biking.  Work with your health care provider or diet and nutrition specialist (dietitian) to adjust your eating plan to your individual calorie needs. Reading food labels   Check food labels for the amount of sodium per serving. Choose foods with less than 5 percent of the Daily Value of sodium. Generally, foods with less than 300 mg of sodium per serving fit into this eating plan.  To find whole grains, look for the word "whole" as the first word in the ingredient list. Shopping  Buy products labeled as "low-sodium" or "no salt added."  Buy fresh foods. Avoid canned foods and premade or frozen meals. Cooking  Avoid adding salt when cooking. Use salt-free seasonings or herbs instead of table salt or sea salt. Check with your health care provider or pharmacist before using salt substitutes.  Do not fry foods. Cook foods using healthy methods such as baking, boiling, grilling,  and broiling instead.  Cook with heart-healthy oils, such as olive, canola, soybean, or sunflower oil. Meal planning  Eat a balanced diet that includes: ? 5 or more servings of fruits and vegetables each day. At each meal, try to fill half of your plate with fruits and vegetables. ? Up to 6-8 servings of whole grains each day. ? Less than 6 oz of lean meat, poultry, or fish each day. A 3-oz serving of meat is about the same size as a deck of cards. One egg equals 1 oz. ? 2 servings of low-fat dairy each day. ? A serving of nuts, seeds, or beans 5 times each week. ? Heart-healthy fats. Healthy fats called Omega-3 fatty acids are found in foods such as flaxseeds and coldwater fish, like sardines, salmon, and mackerel.  Limit how much you eat of the following: ? Canned or prepackaged foods. ? Food that is high in trans fat, such as fried foods. ? Food that is high in saturated fat, such as fatty meat. ? Sweets, desserts, sugary drinks, and other foods with added sugar. ? Full-fat dairy products.  Do not salt foods before eating.  Try to eat at least 2 vegetarian meals each week.  Eat more home-cooked food and less restaurant, buffet, and fast food.  When eating at a restaurant, ask that your food be prepared with less salt or no salt, if possible. What foods are recommended? The items listed may not  be a complete list. Talk with your dietitian about what dietary choices are best for you. Grains Whole-grain or whole-wheat bread. Whole-grain or whole-wheat pasta. Brown rice. [place]. Bulgur. Whole-grain and low-sodium cereals. Pita bread. Low-fat, low-sodium crackers. Whole-wheat flour tortillas. Vegetables Fresh or frozen vegetables (raw, steamed, roasted, or grilled). Low-sodium or reduced-sodium tomato and vegetable juice. Low-sodium or reduced-sodium tomato sauce and tomato paste. Low-sodium or reduced-sodium canned vegetables. Fruits All fresh, dried, or frozen fruit. Canned  fruit in natural juice (without added sugar). Meat and other protein foods Skinless chicken or [place]. Ground chicken or [place]. Pork with fat trimmed off. Fish and seafood. Egg whites. Dried beans, peas, or lentils. Unsalted nuts, nut butters, and seeds. Unsalted canned beans. Lean cuts of beef with fat trimmed off. Low-sodium, lean deli meat. Dairy Low-fat (1%) or fat-free (skim) milk. Fat-free, low-fat, or reduced-fat cheeses. Nonfat, low-sodium ricotta or cottage cheese. Low-fat or nonfat yogurt. Low-fat, low-sodium cheese. Fats and oils Soft margarine without trans fats. Vegetable oil. Low-fat, reduced-fat, or light mayonnaise and salad dressings (reduced-sodium). Canola, safflower, olive, soybean, and sunflower oils. Avocado. Seasoning and other foods Herbs. Spices. Seasoning mixes without salt. Unsalted popcorn and pretzels. Fat-free sweets. What foods are not recommended? The items listed may not be a complete list. Talk with your dietitian about what dietary choices are best for you. Grains Baked goods made with fat, such as croissants, muffins, or some breads. Dry pasta or rice meal packs. Vegetables Creamed or fried vegetables. Vegetables in a cheese sauce. Regular canned vegetables (not low-sodium or reduced-sodium). Regular canned tomato sauce and paste (not low-sodium or reduced-sodium). Regular tomato and vegetable juice (not low-sodium or reduced-sodium). Angie Fava. Olives. Fruits Canned fruit in a light or heavy syrup. Fried fruit. Fruit in cream or butter sauce. Meat and other protein foods Fatty cuts of meat. Ribs. Fried meat. Berniece Salines. Sausage. Bologna and other processed lunch meats. Salami. Fatback. Hotdogs. Bratwurst. Salted nuts and seeds. Canned beans with added salt. Canned or smoked fish. Whole eggs or egg yolks. Chicken or [place] with skin. Dairy Whole or 2% milk, cream, and half-and-half. Whole or full-fat cream cheese. Whole-fat or sweetened yogurt. Full-fat cheese.  Nondairy creamers. Whipped toppings. Processed cheese and cheese spreads. Fats and oils Butter. Stick margarine. Lard. Shortening. Ghee. Bacon fat. Tropical oils, such as coconut, palm kernel, or palm oil. Seasoning and other foods Salted popcorn and pretzels. Onion salt, garlic salt, seasoned salt, table salt, and sea salt. Worcestershire sauce. Tartar sauce. Barbecue sauce. Teriyaki sauce. Soy sauce, including reduced-sodium. Steak sauce. Canned and packaged gravies. Fish sauce. Oyster sauce. Cocktail sauce. Horseradish that you find on the shelf. Ketchup. Mustard. Meat flavorings and tenderizers. Bouillon cubes. Hot sauce and Tabasco sauce. Premade or packaged marinades. Premade or packaged taco seasonings. Relishes. Regular salad dressings. Where to find more information:  National Heart, Lung, and [place]: https://wilson-eaton.com/  American Heart Association: www.heart.org Summary  The DASH eating plan is a healthy eating plan that has been shown to reduce high blood pressure (hypertension). It may also reduce your risk for type 2 diabetes, heart disease, and stroke.  With the DASH eating plan, you should limit salt (sodium) intake to 2,300 mg a day. If you have hypertension, you may need to reduce your sodium intake to 1,500 mg a day.  When on the DASH eating plan, aim to eat more fresh fruits and vegetables, whole grains, lean proteins, low-fat dairy, and heart-healthy fats.  Work with your health care provider or diet and nutrition  specialist (dietitian) to adjust your eating plan to your individual calorie needs. This information is not intended to replace advice given to you by your health care provider. Make sure you discuss any questions you have with your health care provider. Document Released: 11/16/2011 Document Revised: 11/20/2016 Document Reviewed: 11/20/2016 Elsevier Interactive Patient Education  2019 [employer].

## 2019-04-16 ENCOUNTER — Encounter: Payer: Self-pay | Admitting: Internal Medicine

## 2019-04-17 ENCOUNTER — Other Ambulatory Visit: Payer: Self-pay | Admitting: Internal Medicine

## 2019-04-18 LAB — CBC WITH DIFFERENTIAL/PLATELET
Basophils Absolute: 0.1 10*3/uL (ref 0.0–0.2)
Basos: 1 %
EOS (ABSOLUTE): 0.1 10*3/uL (ref 0.0–0.4)
Eos: 1 %
Hematocrit: 37.2 % (ref 34.0–46.6)
Hemoglobin: 12.7 g/dL (ref 11.1–15.9)
Immature Grans (Abs): 0 10*3/uL (ref 0.0–0.1)
Immature Granulocytes: 0 %
Lymphocytes Absolute: 2.4 10*3/uL (ref 0.7–3.1)
Lymphs: 31 %
MCH: 31.8 pg (ref 26.6–33.0)
MCHC: 34.1 g/dL (ref 31.5–35.7)
MCV: 93 fL (ref 79–97)
Monocytes Absolute: 0.6 10*3/uL (ref 0.1–0.9)
Monocytes: 7 %
Neutrophils Absolute: 4.7 10*3/uL (ref 1.4–7.0)
Neutrophils: 60 %
Platelets: 207 10*3/uL (ref 150–450)
RBC: 3.99 x10E6/uL (ref 3.77–5.28)
RDW: 12 % (ref 11.7–15.4)
WBC: 7.8 10*3/uL (ref 3.4–10.8)

## 2019-04-18 LAB — TSH: TSH: 3.33 u[IU]/mL (ref 0.450–4.500)

## 2019-04-18 LAB — T4, FREE: Free T4: 1.04 ng/dL (ref 0.82–1.77)

## 2019-05-14 ENCOUNTER — Other Ambulatory Visit: Payer: Self-pay

## 2019-05-14 ENCOUNTER — Encounter: Payer: Self-pay | Admitting: Internal Medicine

## 2019-05-14 ENCOUNTER — Ambulatory Visit: Payer: BC Managed Care – PPO | Admitting: Internal Medicine

## 2019-05-14 VITALS — BP 122/94 | HR 68 | Temp 97.8°F | Ht 64.0 in | Wt 157.0 lb

## 2019-05-14 DIAGNOSIS — I10 Essential (primary) hypertension: Secondary | ICD-10-CM | POA: Diagnosis not present

## 2019-05-14 DIAGNOSIS — N926 Irregular menstruation, unspecified: Secondary | ICD-10-CM

## 2019-05-14 DIAGNOSIS — Z23 Encounter for immunization: Secondary | ICD-10-CM

## 2019-05-14 DIAGNOSIS — R002 Palpitations: Secondary | ICD-10-CM | POA: Diagnosis not present

## 2019-05-14 MED ORDER — TETANUS-DIPHTH-ACELL PERTUSSIS 5-2.5-18.5 LF-MCG/0.5 IM SUSP
0.5000 mL | Freq: Once | INTRAMUSCULAR | Status: AC
  Administered 2019-05-14: 0.5 mL via INTRAMUSCULAR

## 2019-05-14 NOTE — Patient Instructions (Signed)
DASH Eating Plan  DASH stands for "Dietary Approaches to Stop Hypertension." The DASH eating plan is a healthy eating plan that has been shown to reduce high blood pressure (hypertension). It may also reduce your risk for type 2 diabetes, heart disease, and stroke. The DASH eating plan may also help with weight loss.  What are tips for following this plan?    General guidelines   Avoid eating more than 2,300 mg (milligrams) of salt (sodium) a day. If you have hypertension, you may need to reduce your sodium intake to 1,500 mg a day.   Limit alcohol intake to no more than 1 drink a day for nonpregnant women and 2 drinks a day for men. One drink equals 12 oz of beer, 5 oz of wine, or 1 oz of hard liquor.   Work with your health care provider to maintain a healthy body weight or to lose weight. Ask what an ideal weight is for you.   Get at least 30 minutes of exercise that causes your heart to beat faster (aerobic exercise) most days of the week. Activities may include walking, swimming, or biking.   Work with your health care provider or diet and nutrition specialist (dietitian) to adjust your eating plan to your individual calorie needs.  Reading food labels     Check food labels for the amount of sodium per serving. Choose foods with less than 5 percent of the Daily Value of sodium. Generally, foods with less than 300 mg of sodium per serving fit into this eating plan.   To find whole grains, look for the word "whole" as the first word in the ingredient list.  Shopping   Buy products labeled as "low-sodium" or "no salt added."   Buy fresh foods. Avoid canned foods and premade or frozen meals.  Cooking   Avoid adding salt when cooking. Use salt-free seasonings or herbs instead of table salt or sea salt. Check with your health care provider or pharmacist before using salt substitutes.   Do not fry foods. Cook foods using healthy methods such as baking, boiling, grilling, and broiling instead.   Cook with  heart-healthy oils, such as olive, canola, soybean, or sunflower oil.  Meal planning   Eat a balanced diet that includes:  ? 5 or more servings of fruits and vegetables each day. At each meal, try to fill half of your plate with fruits and vegetables.  ? Up to 6-8 servings of whole grains each day.  ? Less than 6 oz of lean meat, poultry, or fish each day. A 3-oz serving of meat is about the same size as a deck of cards. One egg equals 1 oz.  ? 2 servings of low-fat dairy each day.  ? A serving of nuts, seeds, or beans 5 times each week.  ? Heart-healthy fats. Healthy fats called Omega-3 fatty acids are found in foods such as flaxseeds and coldwater fish, like sardines, salmon, and mackerel.   Limit how much you eat of the following:  ? Canned or prepackaged foods.  ? Food that is high in trans fat, such as fried foods.  ? Food that is high in saturated fat, such as fatty meat.  ? Sweets, desserts, sugary drinks, and other foods with added sugar.  ? Full-fat dairy products.   Do not salt foods before eating.   Try to eat at least 2 vegetarian meals each week.   Eat more home-cooked food and less restaurant, buffet, and fast food.     When eating at a restaurant, ask that your food be prepared with less salt or no salt, if possible.  What foods are recommended?  The items listed may not be a complete list. Talk with your dietitian about what dietary choices are best for you.  Grains  Whole-grain or whole-wheat bread. Whole-grain or whole-wheat pasta. Brown rice. Oatmeal. Quinoa. Bulgur. Whole-grain and low-sodium cereals. Pita bread. Low-fat, low-sodium crackers. Whole-wheat flour tortillas.  Vegetables  Fresh or frozen vegetables (raw, steamed, roasted, or grilled). Low-sodium or reduced-sodium tomato and vegetable juice. Low-sodium or reduced-sodium tomato sauce and tomato paste. Low-sodium or reduced-sodium canned vegetables.  Fruits  All fresh, dried, or frozen fruit. Canned fruit in natural juice (without  added sugar).  Meat and other protein foods  Skinless chicken or turkey. Ground chicken or turkey. Pork with fat trimmed off. Fish and seafood. Egg whites. Dried beans, peas, or lentils. Unsalted nuts, nut butters, and seeds. Unsalted canned beans. Lean cuts of beef with fat trimmed off. Low-sodium, lean deli meat.  Dairy  Low-fat (1%) or fat-free (skim) milk. Fat-free, low-fat, or reduced-fat cheeses. Nonfat, low-sodium ricotta or cottage cheese. Low-fat or nonfat yogurt. Low-fat, low-sodium cheese.  Fats and oils  Soft margarine without trans fats. Vegetable oil. Low-fat, reduced-fat, or light mayonnaise and salad dressings (reduced-sodium). Canola, safflower, olive, soybean, and sunflower oils. Avocado.  Seasoning and other foods  Herbs. Spices. Seasoning mixes without salt. Unsalted popcorn and pretzels. Fat-free sweets.  What foods are not recommended?  The items listed may not be a complete list. Talk with your dietitian about what dietary choices are best for you.  Grains  Baked goods made with fat, such as croissants, muffins, or some breads. Dry pasta or rice meal packs.  Vegetables  Creamed or fried vegetables. Vegetables in a cheese sauce. Regular canned vegetables (not low-sodium or reduced-sodium). Regular canned tomato sauce and paste (not low-sodium or reduced-sodium). Regular tomato and vegetable juice (not low-sodium or reduced-sodium). Pickles. Olives.  Fruits  Canned fruit in a light or heavy syrup. Fried fruit. Fruit in cream or butter sauce.  Meat and other protein foods  Fatty cuts of meat. Ribs. Fried meat. Bacon. Sausage. Bologna and other processed lunch meats. Salami. Fatback. Hotdogs. Bratwurst. Salted nuts and seeds. Canned beans with added salt. Canned or smoked fish. Whole eggs or egg yolks. Chicken or turkey with skin.  Dairy  Whole or 2% milk, cream, and half-and-half. Whole or full-fat cream cheese. Whole-fat or sweetened yogurt. Full-fat cheese. Nondairy creamers. Whipped toppings.  Processed cheese and cheese spreads.  Fats and oils  Butter. Stick margarine. Lard. Shortening. Ghee. Bacon fat. Tropical oils, such as coconut, palm kernel, or palm oil.  Seasoning and other foods  Salted popcorn and pretzels. Onion salt, garlic salt, seasoned salt, table salt, and sea salt. Worcestershire sauce. Tartar sauce. Barbecue sauce. Teriyaki sauce. Soy sauce, including reduced-sodium. Steak sauce. Canned and packaged gravies. Fish sauce. Oyster sauce. Cocktail sauce. Horseradish that you find on the shelf. Ketchup. Mustard. Meat flavorings and tenderizers. Bouillon cubes. Hot sauce and Tabasco sauce. Premade or packaged marinades. Premade or packaged taco seasonings. Relishes. Regular salad dressings.  Where to find more information:   National Heart, Lung, and Blood Institute: www.nhlbi.nih.gov   American Heart Association: www.heart.org  Summary   The DASH eating plan is a healthy eating plan that has been shown to reduce high blood pressure (hypertension). It may also reduce your risk for type 2 diabetes, heart disease, and stroke.   With the   DASH eating plan, you should limit salt (sodium) intake to 2,300 mg a day. If you have hypertension, you may need to reduce your sodium intake to 1,500 mg a day.   When on the DASH eating plan, aim to eat more fresh fruits and vegetables, whole grains, lean proteins, low-fat dairy, and heart-healthy fats.   Work with your health care provider or diet and nutrition specialist (dietitian) to adjust your eating plan to your individual calorie needs.  This information is not intended to replace advice given to you by your health care provider. Make sure you discuss any questions you have with your health care provider.  Document Released: 11/16/2011 Document Revised: 11/20/2016 Document Reviewed: 11/20/2016  Elsevier Interactive Patient Education  2019 [employer]

## 2019-05-16 LAB — CMP14+EGFR
ALT: 15 IU/L (ref 0–32)
AST: 18 IU/L (ref 0–40)
Albumin/Globulin Ratio: 1.6 (ref 1.2–2.2)
Albumin: 4.8 g/dL (ref 3.8–4.8)
Alkaline Phosphatase: 58 IU/L (ref 39–117)
BUN/Creatinine Ratio: 23 (ref 9–23)
BUN: 17 mg/dL (ref 6–24)
Bilirubin Total: 0.3 mg/dL (ref 0.0–1.2)
CO2: 26 mmol/L (ref 20–29)
Calcium: 9.8 mg/dL (ref 8.7–10.2)
Chloride: 101 mmol/L (ref 96–106)
Creatinine, Ser: 0.73 mg/dL (ref 0.57–1.00)
GFR calc Af Amer: 118 mL/min/{1.73_m2} (ref >59)
GFR calc non Af Amer: 103 mL/min/{1.73_m2} (ref >59)
Globulin, Total: 3 g/dL (ref 1.5–4.5)
Glucose: 95 mg/dL (ref 65–99)
Potassium: 4.2 mmol/L (ref 3.5–5.2)
Sodium: 140 mmol/L (ref 134–144)
Total Protein: 7.8 g/dL (ref 6.0–8.5)

## 2019-05-16 LAB — INSULIN, RANDOM: INSULIN: 8.3 u[IU]/mL (ref 2.6–24.9)

## 2019-05-16 LAB — QUANTIFERON-TB GOLD PLUS
QuantiFERON Mitogen Value: >10 IU/mL
QuantiFERON Nil Value: 0.04 IU/mL
QuantiFERON TB1 Ag Value: 0.04 IU/mL
QuantiFERON TB2 Ag Value: 0.03 IU/mL
QuantiFERON-TB Gold Plus: NEGATIVE

## 2019-05-16 LAB — HEMOGLOBIN A1C
Est. average glucose Bld gHb Est-mCnc: 97 mg/dL
Hgb A1c MFr Bld: 5 % (ref 4.8–5.6)

## 2019-05-16 LAB — HIV ANTIBODY (ROUTINE TESTING W REFLEX): HIV Screen 4th Generation wRfx: NONREACTIVE

## 2019-05-16 LAB — MAGNESIUM: Magnesium: 2.3 mg/dL (ref 1.6–2.3)

## 2019-05-18 ENCOUNTER — Encounter: Payer: Self-pay | Admitting: Internal Medicine

## 2019-05-18 NOTE — Progress Notes (Signed)
Subjective:     Patient ID: Audrey Bradford , female    DOB: [birth year] , 43 y.o.   MRN: [id number]   Chief Complaint  Patient presents with  . Hypertension    HPI  She is here today for f/u elevated blood pressure. She admits that she is under a lot of stress. She is transferring to new job this year. She is transferring from [employer] to [facility] system. She is happy to get out of the toxic environment she was in. She adds that it is stressful to work from home.     Past Medical History:  Diagnosis Date  . Seasonal allergies      Family History  Problem Relation Age of Onset  . Schizophrenia Mother   . Hypertension Mother   . Hypertension Father   . Diabetes Father   . Esophageal cancer Father   . Congestive Heart Failure Father      Current Outpatient Medications:  .  Cholecalciferol (VITAMIN D3 SUPER STRENGTH) 50 MCG (2000 UT) CAPS, Take by mouth., Disp: , Rfl:  .  fexofenadine (ALLEGRA) 180 MG tablet, Take 180 mg by mouth daily., Disp: , Rfl:  .  ipratropium (ATROVENT) 0.03 % nasal spray, Place 2 sprays into both nostrils every 12 (twelve) hours., Disp: , Rfl:  .  valACYclovir (VALTREX) 500 MG tablet, , Disp: , Rfl:  .  levocetirizine (XYZAL) 5 MG tablet, Take 1 tablet (5 mg total) by mouth every evening. (Patient not taking: Reported on 05/14/2019), Disp: 30 tablet, Rfl: 1   No Known Allergies   Review of Systems  Constitutional: Negative.   Respiratory: Negative.   Cardiovascular:       She c/o intermittent palpitations. Unable to determine what triggers her sx. She denies associated chest pain.   Gastrointestinal: Negative.   Neurological: Negative.   Psychiatric/Behavioral: Negative.      Today's Vitals   05/14/19 0913  BP: (!) 122/94  Pulse: 68  Temp: 97.8 F (36.6 C)  TempSrc: Oral  Weight: 157 lb (71.2 kg)  Height: _0  (1.626 m)   Body mass index is 26.95 kg/m.   Objective:  Physical Exam Vitals signs and  nursing note reviewed.  Constitutional:      Appearance: Normal appearance.  HENT:     Head: Normocephalic and atraumatic.  Cardiovascular:     Rate and Rhythm: Normal rate and regular rhythm.     Heart sounds: Normal heart sounds.  Pulmonary:     Effort: Pulmonary effort is normal.     Breath sounds: Normal breath sounds.  Skin:    General: Skin is warm.  Neurological:     General: No focal deficit present.     Mental Status: She is alert.  Psychiatric:        Mood and Affect: Mood normal.        Behavior: Behavior normal.         Assessment And Plan:     1. Essential hypertension, benign  She does not wish to start meds if possible. Pt advised that it is best to start meds now, make lifestyle changes and possibly wean off of the medication in the future. She is encouraged to avoid adding salt to her foods and given information on the DASH diet.   - QuantiFERON-TB Gold Plus  2. Irregular menses  Her sx may be due to stress related to work, COVID pandemic. I will check labs as listed below. She plans to f/u  with GYN once she established with provider in [place].   - HIV antibody (with reflex) - Insulin, random(561) - Hemoglobin A1c  3. Palpitations  She is advised to take magnesium 438m nightly and to stay well hydrated. I will refer her to cardiologist for further evaluation. She would like to be referred to a provider in [place]   - CMP14+EGFR - Magnesium  4. Need for vaccination  - Tdap (BOOSTRIX) injection 0.5 mL        RMaximino Greenland MD    THE PATIENT IS ENCOURAGED TO PRACTICE SOCIAL DISTANCING DUE TO THE COVID-19 PANDEMIC.

## 2019-05-23 ENCOUNTER — Encounter: Payer: Self-pay | Admitting: Internal Medicine

## 2019-06-01 ENCOUNTER — Encounter: Payer: Self-pay | Admitting: Internal Medicine

## 2019-06-03 ENCOUNTER — Other Ambulatory Visit: Payer: Self-pay | Admitting: Internal Medicine

## 2019-06-03 DIAGNOSIS — R002 Palpitations: Secondary | ICD-10-CM

## 2019-06-03 DIAGNOSIS — R030 Elevated blood-pressure reading, without diagnosis of hypertension: Secondary | ICD-10-CM
